# Patient Record
Sex: Male | Born: 1995 | Race: Black or African American | Hispanic: No | Marital: Single | Smoking: Never smoker
Health system: Southern US, Community
[De-identification: ages and names within clinical notes are randomized; demographics above are authoritative.]

## PROBLEM LIST (undated history)

## (undated) DIAGNOSIS — R569 Unspecified convulsions: Secondary | ICD-10-CM

---
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
## 2019-08-09 ENCOUNTER — Emergency Department (HOSPITAL_COMMUNITY): Payer: Medicaid Other

## 2019-08-09 ENCOUNTER — Encounter (HOSPITAL_COMMUNITY): Payer: Self-pay | Admitting: Emergency Medicine

## 2019-08-09 ENCOUNTER — Other Ambulatory Visit: Payer: Self-pay

## 2019-08-09 ENCOUNTER — Emergency Department (HOSPITAL_COMMUNITY)
Admission: EM | Admit: 2019-08-09 | Discharge: 2019-08-09 | Disposition: A | Discharge status: Home / Self Care | Payer: Medicaid Other | Attending: Emergency Medicine | Admitting: Emergency Medicine

## 2019-08-09 DIAGNOSIS — R569 Unspecified convulsions: Secondary | ICD-10-CM | POA: Insufficient documentation

## 2019-08-09 LAB — ETHANOL: Alcohol, Ethyl (B): <10 mg/dL (ref <10)

## 2019-08-09 LAB — COMPREHENSIVE METABOLIC PANEL
ALT: 13 U/L (ref 0–44)
AST: 22 U/L (ref 15–41)
Albumin: 4.2 g/dL (ref 3.5–5.0)
Alkaline Phosphatase: 46 U/L (ref 38–126)
Anion gap: 11 (ref 5–15)
BUN: 8 mg/dL (ref 6–20)
CO2: 27 mmol/L (ref 22–32)
Calcium: 9.4 mg/dL (ref 8.9–10.3)
Chloride: 103 mmol/L (ref 98–111)
Creatinine, Ser: 0.94 mg/dL (ref 0.61–1.24)
GFR calc Af Amer: >60 mL/min (ref >60)
GFR calc non Af Amer: >60 mL/min (ref >60)
Glucose, Bld: 93 mg/dL (ref 70–99)
Potassium: 4.3 mmol/L (ref 3.5–5.1)
Sodium: 141 mmol/L (ref 135–145)
Total Bilirubin: 1.2 mg/dL (ref 0.3–1.2)
Total Protein: 7.3 g/dL (ref 6.5–8.1)

## 2019-08-09 LAB — RAPID URINE DRUG SCREEN, HOSP PERFORMED
Amphetamines: NOT DETECTED
Barbiturates: NOT DETECTED
Benzodiazepines: NOT DETECTED
Cocaine: NOT DETECTED
Opiates: NOT DETECTED
Tetrahydrocannabinol: NOT DETECTED

## 2019-08-09 LAB — CBC WITH DIFFERENTIAL/PLATELET
Abs Immature Granulocytes: 0.02 10*3/uL (ref 0.00–0.07)
Basophils Absolute: 0.1 10*3/uL (ref 0.0–0.1)
Basophils Relative: 1 %
Eosinophils Absolute: 0.2 10*3/uL (ref 0.0–0.5)
Eosinophils Relative: 2 %
HCT: 43.5 % (ref 39.0–52.0)
Hemoglobin: 14 g/dL (ref 13.0–17.0)
Immature Granulocytes: 0 %
Lymphocytes Relative: 31 %
Lymphs Abs: 2.5 10*3/uL (ref 0.7–4.0)
MCH: 26.3 pg (ref 26.0–34.0)
MCHC: 32.2 g/dL (ref 30.0–36.0)
MCV: 81.6 fL (ref 80.0–100.0)
Monocytes Absolute: 0.6 10*3/uL (ref 0.1–1.0)
Monocytes Relative: 7 %
Neutro Abs: 4.9 10*3/uL (ref 1.7–7.7)
Neutrophils Relative %: 59 %
Platelets: 231 10*3/uL (ref 150–400)
RBC: 5.33 MIL/uL (ref 4.22–5.81)
RDW: 12.3 % (ref 11.5–15.5)
WBC: 8.2 10*3/uL (ref 4.0–10.5)
nRBC: 0 % (ref 0.0–0.2)

## 2019-08-09 MED ORDER — SODIUM CHLORIDE 0.9 % IV BOLUS
1000.0000 mL | Freq: Once | INTRAVENOUS | Status: AC
  Administered 2019-08-09: 1000 mL via INTRAVENOUS

## 2019-08-09 MED ORDER — SODIUM CHLORIDE 0.9 % IV SOLN: INTRAVENOUS | Status: DC

## 2019-08-09 NOTE — Discharge Instructions (Signed)
Follow-up with neurology doctor and consider seeing a psychiatry doctor when you are able.

## 2019-08-09 NOTE — ED Notes (Signed)
Patient Alert and oriented to baseline. Stable and ambulatory to baseline. Patient verbalized understanding of the discharge instructions.  Patient belongings were taken by the patient.   

## 2019-08-09 NOTE — ED Triage Notes (Addendum)
Witnessed Seizure activity lasting about 5 mins. Nurses at facility reports he was postictal for about 1 mins. EMS reports pt was alert and oriented upon arrival. Has not been diagnosed with seizures but states he has had seizures during stressful situations in the past.

## 2019-08-09 NOTE — ED Provider Notes (Signed)
[facility] EMERGENCY DEPARTMENT Provider Note   CSN: [id number] Arrival date & time: 08/09/19  1604     History   Chief Complaint Chief Complaint  Patient presents with  . Seizures    HPI Ronald Jimenez is a 23 y.o. male.     HPI Patient presents to the ED for evaluation of seizure-like activity.  Patient is currently incarcerated.  He had a witnessed episode of shaking/seizure-like activity that lasted for 5 minutes which spontaneously resolved.  Patient apparently was confused and postictal for 1 minutes afterwards and then returned to his baseline.  Upon EMS arrival the patient was alert and awake.  Patient states he has a history of seizures since 2018 after his brother passed away.  Patient states they are triggered by stressful situations.  Often he will see his brother before these episodes occur.  Patient states he may have had maybe 40 episodes in the last 2 years.  He was seen at another emergency room once for but has not followed up with a neurologist since that time.  He is currently incarcerated but that just occurred earlier this month. History reviewed. No pertinent past medical history.  There are no active problems to display for this patient.   History reviewed. No pertinent surgical history.      Home Medications    Prior to Admission medications   Not on File    Family History No family history on file.  Social History Social History   Tobacco Use  . Smoking status: Not on file  Substance Use Topics  . Alcohol use: Not on file  . Drug use: Not on file     Allergies   Patient has no allergy information on record.   Review of Systems Review of Systems  Constitutional: Negative for fever.  Respiratory: Negative for chest tightness.   Cardiovascular: Negative for chest pain.  Gastrointestinal: Negative for abdominal pain.  Genitourinary: Negative for dysuria.  Neurological: Negative for weakness and headaches.  All  other systems reviewed and are negative.    Physical Exam Updated Vital Signs BP (!) 143/91 (BP Location: Right Arm)   Pulse 60   Temp 98.6 F (37 C) (Oral)   Resp 14   Ht 1.676 m (5\' 6" )   Wt 68.5 kg   SpO2 100%   BMI 24.37 kg/m   Physical Exam Vitals signs and nursing note reviewed.  Constitutional:      General: He is not in acute distress.    Appearance: He is well-developed.  HENT:     Head: Normocephalic and atraumatic.     Right Ear: External ear normal.     Left Ear: External ear normal.  Eyes:     General: No scleral icterus.       Right eye: No discharge.        Left eye: No discharge.     Conjunctiva/sclera: Conjunctivae normal.  Neck:     Musculoskeletal: Neck supple.     Trachea: No tracheal deviation.  Cardiovascular:     Rate and Rhythm: Normal rate and regular rhythm.  Pulmonary:     Effort: Pulmonary effort is normal. No respiratory distress.     Breath sounds: Normal breath sounds. No stridor. No wheezing or rales.  Abdominal:     General: Bowel sounds are normal. There is no distension.     Palpations: Abdomen is soft.     Tenderness: There is no abdominal tenderness. There is no guarding or rebound.  Musculoskeletal:        General: No tenderness.  Skin:    General: Skin is warm and dry.     Findings: No rash.  Neurological:     General: No focal deficit present.     Mental Status: He is alert and oriented to person, place, and time. Mental status is at baseline.     Cranial Nerves: No cranial nerve deficit (no facial droop, extraocular movements intact, no slurred speech).     Sensory: No sensory deficit.     Motor: No abnormal muscle tone or seizure activity.     Coordination: Coordination normal.      ED Treatments / Results  Labs (all labs ordered are listed, but only abnormal results are displayed) Labs Reviewed  COMPREHENSIVE METABOLIC PANEL  ETHANOL  CBC WITH DIFFERENTIAL/PLATELET  RAPID URINE DRUG SCREEN, HOSP PERFORMED   CBG MONITORING, ED    EKG EKG Interpretation  Date/Time:  Sunday August 09 2019 16:17:30 EDT Ventricular Rate:  61 PR Interval:    QRS Duration: 85 QT Interval:  413 QTC Calculation: 416 R Axis:   77 Text Interpretation:  Sinus rhythm RAE, consider biatrial enlargement ST elev, probable normal early repol pattern No old tracing to compare Confirmed by Linwood DibblesKnapp, Elis Rawlinson [phone]([id number]) on 08/09/2019 4:18:31 PM   Radiology Ct Head Wo Contrast  Result Date: 08/09/2019 CLINICAL DATA:  Seizure, new, nontraumatic, 18-40 yrs EXAM: CT HEAD WITHOUT CONTRAST TECHNIQUE: Contiguous axial images were obtained from the base of the skull through the vertex without intravenous contrast. COMPARISON:  None. FINDINGS: Brain: No intracranial hemorrhage, mass effect, or midline shift. No hydrocephalus. The basilar cisterns are patent. No evidence of territorial infarct or acute ischemia. No extra-axial or intracranial fluid collection. Vascular: No hyperdense vessel or unexpected calcification. Skull: No fracture or focal lesion. Sinuses/Orbits: Mucosal thickening of the ethmoid air cells, frontal, and maxillary sinuses. The mastoid air cells are clear. Other: Minimal right parietal scalp scarring. IMPRESSION: 1. Negative CT appearance of the brain. 2. Paranasal sinus mucosal thickening. Electronically Signed   By: Narda RutherfordMelanie  Sanford M.D.   On: 08/09/2019 19:10    Procedures Procedures (including critical care time)  Medications Ordered in ED Medications  sodium chloride 0.9 % bolus 1,000 mL (1,000 mLs Intravenous New Bag/Given 08/09/19 1724)    And  0.9 %  sodium chloride infusion (has no administration in time range)     Initial Impression / Assessment and Plan / ED Course  I have reviewed the triage vital signs and the nursing notes.  Pertinent labs & imaging results that were available during my care of the patient were reviewed by me and considered in my medical decision making (see chart for details).    Patient's ED work-up is reassuring.  Laboratory tests and CT scan are unremarkable.  Patient was monitored in the ED and there were no recurrent episodes.  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  Patient states his episodes are triggered by stressful activity.  These have been associated with recent traumas and his wife with the death of his brother and his uncle.  Patient is also currently incarcerated.  He admits to being under a lot of stress.  I think the patient would benefit from outpatient neurology and psychiatry evaluation but he is stable for discharge and does not require hospitalization at this time.  Pt is agreeable to this plan.  Final Clinical Impressions(s) / ED Diagnoses   Final diagnoses:  Seizure-like activity (HCC)  ED Discharge Orders    None       Dorie Rank, MD 08/09/19 1931

## 2020-09-25 ENCOUNTER — Encounter (HOSPITAL_COMMUNITY): Payer: Self-pay | Admitting: Emergency Medicine

## 2020-09-25 ENCOUNTER — Emergency Department (HOSPITAL_COMMUNITY)
Admission: EM | Admit: 2020-09-25 | Discharge: 2020-09-25 | Disposition: A | Discharge status: Home / Self Care | Attending: Emergency Medicine | Admitting: Emergency Medicine

## 2020-09-25 ENCOUNTER — Other Ambulatory Visit: Payer: Self-pay

## 2020-09-25 DIAGNOSIS — R569 Unspecified convulsions: Secondary | ICD-10-CM | POA: Diagnosis present

## 2020-09-25 LAB — CBC
HCT: 47 % (ref 39.0–52.0)
Hemoglobin: 15.3 g/dL (ref 13.0–17.0)
MCH: 26.8 pg (ref 26.0–34.0)
MCHC: 32.6 g/dL (ref 30.0–36.0)
MCV: 82.5 fL (ref 80.0–100.0)
Platelets: 241 10*3/uL (ref 150–400)
RBC: 5.7 MIL/uL (ref 4.22–5.81)
RDW: 12.5 % (ref 11.5–15.5)
WBC: 8.6 10*3/uL (ref 4.0–10.5)
nRBC: 0 % (ref 0.0–0.2)

## 2020-09-25 LAB — BASIC METABOLIC PANEL
Anion gap: 6 (ref 5–15)
BUN: 12 mg/dL (ref 6–20)
CO2: 31 mmol/L (ref 22–32)
Calcium: 9.6 mg/dL (ref 8.9–10.3)
Chloride: 103 mmol/L (ref 98–111)
Creatinine, Ser: 1.04 mg/dL (ref 0.61–1.24)
GFR, Estimated: >60 mL/min (ref >60)
Glucose, Bld: 96 mg/dL (ref 70–99)
Potassium: 4.2 mmol/L (ref 3.5–5.1)
Sodium: 140 mmol/L (ref 135–145)

## 2020-09-25 NOTE — Discharge Instructions (Signed)
Please read and follow all provided instructions.  Your diagnoses today include:  1. Seizure-like activity (HCC)     Tests performed today include: Blood cell counts (white, red, and platelets) Electrolytes  Kidney function test  Vital signs. See below for your results today.   Medications prescribed:   None  Take any prescribed medications only as directed.  Home care instructions:  Follow any educational materials contained in this packet.  BE VERY CAREFUL not to take multiple medicines containing Tylenol (also called acetaminophen). Doing so can lead to an overdose which can damage your liver and cause liver failure and possibly death.   Follow-up instructions: Please follow-up with your primary care provider as needed for further evaluation of your symptoms.   Return instructions:   Please return to the Emergency Department if you experience worsening symptoms.   Please return if you have any other emergent concerns.  Additional Information:  Your vital signs today were: BP 137/83   Pulse 70   Temp 98.5 F (36.9 C) (Oral)   Resp 16   SpO2 100%  If your blood pressure (BP) was elevated above 135/85 this visit, please have this repeated by your doctor within one month. --------------

## 2020-09-25 NOTE — ED Provider Notes (Signed)
[facility]   CSN: [id number] Arrival date & time: 09/25/20  1630     History Chief Complaint  Patient presents with  . Seizures    Ronald Jimenez is a 24 y.o. male.  Patient with history of nonepileptic seizures presents the emergency department with several shaking episodes today.  Per EMS, patient had an episode this morning and then again this afternoon.  Patient states that he does not remember these episodes when he has them.  He has had them in the past when he is in a stressful situation.  He does not take any medications.  Patient is unwilling to answer my questions by shaking his head yes or no.  When asked if he has been stressed out he shakes his head yes.  He denies biting his tongue, headache, nausea or vomiting.  When asked if he has been eating and drinking well, he shakes his head yes.        History reviewed. No pertinent past medical history.  There are no problems to display for this patient.   History reviewed. No pertinent surgical history.     History reviewed. No pertinent family history.  Social History   Tobacco Use  . Smoking status: Not on file  Substance Use Topics  . Alcohol use: Not on file  . Drug use: Not on file    Home Medications Prior to Admission medications   Not on File    Allergies    Penicillin g  Review of Systems   Review of Systems  Constitutional: Negative for fever.  HENT: Negative for rhinorrhea and sore throat.   Eyes: Negative for redness.  Respiratory: Negative for cough.   Cardiovascular: Negative for chest pain.  Gastrointestinal: Negative for abdominal pain, diarrhea, nausea and vomiting.  Genitourinary: Negative for dysuria and hematuria.  Musculoskeletal: Negative for myalgias.  Skin: Negative for rash.  Neurological: Positive for seizures. Negative for headaches.    Physical Exam Updated Vital Signs BP 136/88 (BP Location: Right Arm)   Pulse 72    Temp 98.5 F (36.9 C) (Oral)   Resp 18   SpO2 100%   Physical Exam Vitals and nursing note reviewed.  Constitutional:      Appearance: He is well-developed.  HENT:     Head: Normocephalic and atraumatic.     Mouth/Throat:     Comments: No lateral tongue biting. Eyes:     General:        Right eye: No discharge.        Left eye: No discharge.     Conjunctiva/sclera: Conjunctivae normal.  Cardiovascular:     Rate and Rhythm: Normal rate and regular rhythm.     Heart sounds: Normal heart sounds.  Pulmonary:     Effort: Pulmonary effort is normal.     Breath sounds: Normal breath sounds.  Abdominal:     Palpations: Abdomen is soft.     Tenderness: There is no abdominal tenderness.  Musculoskeletal:     Cervical back: Normal range of motion and neck supple.  Skin:    General: Skin is warm and dry.  Neurological:     General: No focal deficit present.     Mental Status: He is alert. Mental status is at baseline.     Sensory: No sensory deficit.     Motor: No weakness.     ED Results / Procedures / Treatments   Labs (all labs ordered are listed, but only abnormal results  are displayed) Labs Reviewed  CBC  BASIC METABOLIC PANEL    EKG None  Radiology No results found.  Procedures Procedures (including critical care time)  Medications Ordered in ED Medications - No data to display  ED Course  I have reviewed the triage vital signs and the nursing notes.  Pertinent labs & imaging results that were available during my care of the patient were reviewed by me and considered in my medical decision making (see chart for details).  Patient seen and examined. Work-up initiated.   Vital signs reviewed and are as follows: BP 136/88 (BP Location: Right Arm)   Pulse 72   Temp 98.5 F (36.9 C) (Oral)   Resp 18   SpO2 100%   Patient is currently at baseline.  No postictal state.  No obvious or gross neurologic abnormalities.  Will monitor and check CBC BMP as he  is coming from [facility].  If these are normal, anticipate discharge.  Symptoms are consistent with previous visits for nonepileptic seizures.  I reviewed 2 charts from 2020 which were similar.  6:39 PM labs are normal.  Patient stable.  Ready for discharge.    MDM Rules/Calculators/A&P                          Patient with history of nonepileptic seizures, here after 2 seizure-like spells today.  He is stable during ED stay.  No indication for further evaluation.  He has had previous head CTs which were negative.  Plan for discharge.   Final Clinical Impression(s) / ED Diagnoses Final diagnoses:  Seizure-like activity [facility])    Rx / DC Orders ED Discharge Orders    None       Renne Crigler, PA-C 09/25/20 1839    Charlynne Pander, MD 09/25/20 2026

## 2020-09-25 NOTE — ED Triage Notes (Signed)
BIBA Per EMS: Pt coming from jail with seizure like activity that began this morning. Only lasted a few minutes and then Pt experienced activity again this afternoon. Seizure like activity goes and comes per EMS.  Hx pseudo seizures  Vitals:  97 CBG  132/80  100 room air

## 2020-11-05 ENCOUNTER — Inpatient Hospital Stay (HOSPITAL_COMMUNITY)
Admission: EM | Admit: 2020-11-05 | Discharge: 2020-11-08 | DRG: 101 | Attending: Internal Medicine | Admitting: Internal Medicine

## 2020-11-05 ENCOUNTER — Other Ambulatory Visit: Payer: Self-pay

## 2020-11-05 DIAGNOSIS — T424X5A Adverse effect of benzodiazepines, initial encounter: Secondary | ICD-10-CM | POA: Diagnosis present

## 2020-11-05 DIAGNOSIS — Z20822 Contact with and (suspected) exposure to covid-19: Secondary | ICD-10-CM | POA: Diagnosis present

## 2020-11-05 DIAGNOSIS — R569 Unspecified convulsions: Principal | ICD-10-CM | POA: Diagnosis present

## 2020-11-05 DIAGNOSIS — Z880 Allergy status to penicillin: Secondary | ICD-10-CM

## 2020-11-05 DIAGNOSIS — Z79899 Other long term (current) drug therapy: Secondary | ICD-10-CM

## 2020-11-05 DIAGNOSIS — R9401 Abnormal electroencephalogram [EEG]: Secondary | ICD-10-CM | POA: Diagnosis present

## 2020-11-05 DIAGNOSIS — F319 Bipolar disorder, unspecified: Secondary | ICD-10-CM | POA: Diagnosis present

## 2020-11-05 LAB — VALPROIC ACID LEVEL: Valproic Acid Lvl: 38 ug/mL — ABNORMAL LOW (ref 50.0–100.0)

## 2020-11-05 LAB — CBC WITH DIFFERENTIAL/PLATELET
Abs Immature Granulocytes: 0.01 10*3/uL (ref 0.00–0.07)
Basophils Absolute: 0.1 10*3/uL (ref 0.0–0.1)
Basophils Relative: 1 %
Eosinophils Absolute: 0.5 10*3/uL (ref 0.0–0.5)
Eosinophils Relative: 7 %
HCT: 45.6 % (ref 39.0–52.0)
Hemoglobin: 14.6 g/dL (ref 13.0–17.0)
Immature Granulocytes: 0 %
Lymphocytes Relative: 29 %
Lymphs Abs: 2.2 10*3/uL (ref 0.7–4.0)
MCH: 26.8 pg (ref 26.0–34.0)
MCHC: 32 g/dL (ref 30.0–36.0)
MCV: 83.7 fL (ref 80.0–100.0)
Monocytes Absolute: 0.6 10*3/uL (ref 0.1–1.0)
Monocytes Relative: 8 %
Neutro Abs: 4.2 10*3/uL (ref 1.7–7.7)
Neutrophils Relative %: 55 %
Platelets: 237 10*3/uL (ref 150–400)
RBC: 5.45 MIL/uL (ref 4.22–5.81)
RDW: 12.7 % (ref 11.5–15.5)
WBC: 7.7 10*3/uL (ref 4.0–10.5)
nRBC: 0 % (ref 0.0–0.2)

## 2020-11-05 LAB — COMPREHENSIVE METABOLIC PANEL
ALT: 37 U/L (ref 0–44)
AST: 27 U/L (ref 15–41)
Albumin: 4 g/dL (ref 3.5–5.0)
Alkaline Phosphatase: 55 U/L (ref 38–126)
Anion gap: 7 (ref 5–15)
BUN: 13 mg/dL (ref 6–20)
CO2: 28 mmol/L (ref 22–32)
Calcium: 9 mg/dL (ref 8.9–10.3)
Chloride: 104 mmol/L (ref 98–111)
Creatinine, Ser: 0.95 mg/dL (ref 0.61–1.24)
GFR, Estimated: >60 mL/min (ref >60)
Glucose, Bld: 99 mg/dL (ref 70–99)
Potassium: 4 mmol/L (ref 3.5–5.1)
Sodium: 139 mmol/L (ref 135–145)
Total Bilirubin: 0.4 mg/dL (ref 0.3–1.2)
Total Protein: 7.4 g/dL (ref 6.5–8.1)

## 2020-11-05 LAB — CBG MONITORING, ED: Glucose-Capillary: 99 mg/dL (ref 70–99)

## 2020-11-05 NOTE — ED Provider Notes (Signed)
[facility]-EMERG[facility] DEPT Provider Note   CSN: [id number] Arrival date & time: 11/05/20  2117     History Chief Complaint  Patient presents with  . Seizures    Ronald Jimenez is a 24 y.o. male presenting for evaluation of seizure.  Patient states he had a seizure while incarcerated.  He states he was laying down when this happened, and immediate noticing shaking.  He states he was slightly confused afterwards, but no also bowel bladder control.  He did not bite his tongue.  He has no pain after the seizure.  He states this is his third seizure that he has had.  He is not currently on any medication for seizures.  He denies recent fevers, chills, URI symptoms, cough, chest pain, shortness breath, nausea, vomiting, abdominal pain, urinary symptoms, abnormal bowel movements.  He has not yet followed up outpatient with neurology.  He states he is on Depakote and Abilify and "something else "for bipolar.  HPI     No past medical history on file.  There are no problems to display for this patient.   No past surgical history on file.     No family history on file.     Home Medications Prior to Admission medications   Medication Sig Start Date End Date Taking? Authorizing Provider  ARIPiprazole (ABILIFY PO) Take by mouth.    [provider]  Divalproex Sodium (DEPAKOTE PO) Take by mouth.    [provider]    Allergies    Penicillin g  Review of Systems   Review of Systems  Neurological: Positive for seizures.  All other systems reviewed and are negative.   Physical Exam Updated Vital Signs BP 138/86   Pulse 66   Temp 98.4 F (36.9 C) (Oral)   Resp 16   Ht 5\' 7"  (1.702 m)   Wt 64.4 kg   SpO2 98%   BMI 22.24 kg/m   Physical Exam Vitals and nursing note reviewed.  Constitutional:      General: He is not in acute distress.    Appearance: He is well-developed and well-nourished.     Comments: Appears nontoxic  HENT:      Head: Normocephalic and atraumatic.  Eyes:     Extraocular Movements: Extraocular movements intact and EOM normal.     Conjunctiva/sclera: Conjunctivae normal.     Pupils: Pupils are equal, round, and reactive to light.  Neck:     Comments: No ttp Cardiovascular:     Rate and Rhythm: Normal rate and regular rhythm.     Pulses: Normal pulses and intact distal pulses.  Pulmonary:     Effort: Pulmonary effort is normal. No respiratory distress.     Breath sounds: Normal breath sounds. No wheezing.  Abdominal:     General: There is no distension.     Palpations: Abdomen is soft. There is no mass.     Tenderness: There is no abdominal tenderness. There is no guarding or rebound.  Musculoskeletal:     Cervical back: Normal range of motion and neck supple.     Comments: ROM of extremities limited by shackles.   Skin:    General: Skin is warm and dry.     Capillary Refill: Capillary refill takes less than 2 seconds.  Neurological:     Mental Status: He is alert and oriented to person, place, and time.     GCS: GCS eye subscore is 4. GCS verbal subscore is 5. GCS motor subscore is  6.     Cranial Nerves: Cranial nerves are intact.     Sensory: Sensation is intact.     Comments: GCS of 15.  CN intact.  Sensation intact x4.  Grip strength equal bilaterally.  Ankle flexion and extension equal bilaterally.  Psychiatric:        Mood and Affect: Mood and affect normal.     ED Results / Procedures / Treatments   Labs (all labs ordered are listed, but only abnormal results are displayed) Labs Reviewed  VALPROIC ACID LEVEL - Abnormal; Notable for the following components:      Result Value   Valproic Acid Lvl 38 (*)    All other components within normal limits  RESP PANEL BY RT-PCR (FLU A&B, COVID) ARPGX2  CBC WITH DIFFERENTIAL/PLATELET  COMPREHENSIVE METABOLIC PANEL  URINALYSIS, ROUTINE W REFLEX MICROSCOPIC  RAPID URINE DRUG SCREEN, HOSP PERFORMED  CBG MONITORING, ED     EKG None  Radiology No results found.  Procedures Procedures (including critical care time)  Medications Ordered in ED Medications  valproate (DEPACON) 1,000 mg in dextrose 5 % 50 mL IVPB (0 mg Intravenous Stopped 11/06/20 0152)    ED Course  I have reviewed the triage vital signs and the nursing notes.  Pertinent labs & imaging results that were available during my care of the patient were reviewed by me and considered in my medical decision making (see chart for details).    MDM Rules/Calculators/A&P                          Patient resenting for evaluation of supposed seizure.  On exam, patient is nontoxic.  Unable to verify patient truly had a postictal period.  This is supposedly patient's third seizure, as such, he may need to be on antiepileptics.  However he does report he is on Depakote, this is for bipolar.  Will consult with neurology.  Will obtain labs to ensure no underlying metabolic abnormality.  Labs interpreted by me, overall reassuring.  No obvious electrolyte abnormality.  As patient had a negative head CT last time, I do not believe he needs repeat.  Will consult neurology.  Discussed with Dr. Derry Lory from neurology, who recommends 1000 mg loading dose of Depakote.  Recommends 24-hour observation to ensure no further seizures.  As we do not know what dose of Depakote patient is on daily, he recommends at discharge patient be instructed to take 250 mg higher dose that he is currently on both in the morning and at night.  Discussed with Dr Sedalia Muta from the admitting team, patient to be admitted.  Final Clinical Impression(s) / ED Diagnoses Final diagnoses:  Seizure-like activity [facility])    Rx / DC Orders ED Discharge Orders    None       Alveria Apley, PA-C 11/06/20 0450    Molpus, Jonny Ruiz, MD 11/06/20 [phone]

## 2020-11-05 NOTE — ED Triage Notes (Signed)
Ar[facility] by EMS from [facility] facility. Patient had 2 witnessed seizures by staff. First seizure around 1830 second seizure around 2100. Patient A&O X4 on arrival to this facility.

## 2020-11-06 ENCOUNTER — Observation Stay (HOSPITAL_COMMUNITY)

## 2020-11-06 DIAGNOSIS — T424X5A Adverse effect of benzodiazepines, initial encounter: Secondary | ICD-10-CM | POA: Diagnosis present

## 2020-11-06 DIAGNOSIS — Z880 Allergy status to penicillin: Secondary | ICD-10-CM | POA: Diagnosis not present

## 2020-11-06 DIAGNOSIS — Z79899 Other long term (current) drug therapy: Secondary | ICD-10-CM | POA: Diagnosis not present

## 2020-11-06 DIAGNOSIS — Z20822 Contact with and (suspected) exposure to covid-19: Secondary | ICD-10-CM | POA: Diagnosis present

## 2020-11-06 DIAGNOSIS — F319 Bipolar disorder, unspecified: Secondary | ICD-10-CM | POA: Diagnosis present

## 2020-11-06 DIAGNOSIS — R569 Unspecified convulsions: Secondary | ICD-10-CM | POA: Diagnosis present

## 2020-11-06 DIAGNOSIS — R9401 Abnormal electroencephalogram [EEG]: Secondary | ICD-10-CM | POA: Diagnosis present

## 2020-11-06 LAB — URINALYSIS, ROUTINE W REFLEX MICROSCOPIC
Bilirubin Urine: NEGATIVE
Glucose, UA: NEGATIVE mg/dL
Hgb urine dipstick: NEGATIVE
Ketones, ur: NEGATIVE mg/dL
Leukocytes,Ua: NEGATIVE
Nitrite: NEGATIVE
Protein, ur: NEGATIVE mg/dL
Specific Gravity, Urine: 1.023 (ref 1.005–1.030)
pH: 8 (ref 5.0–8.0)

## 2020-11-06 LAB — RAPID URINE DRUG SCREEN, HOSP PERFORMED
Amphetamines: NOT DETECTED
Barbiturates: NOT DETECTED
Benzodiazepines: NOT DETECTED
Cocaine: NOT DETECTED
Opiates: NOT DETECTED
Tetrahydrocannabinol: NOT DETECTED

## 2020-11-06 LAB — RESP PANEL BY RT-PCR (FLU A&B, COVID) ARPGX2
Influenza A by PCR: NEGATIVE
Influenza B by PCR: NEGATIVE
SARS Coronavirus 2 by RT PCR: NEGATIVE

## 2020-11-06 LAB — HIV ANTIBODY (ROUTINE TESTING W REFLEX): HIV Screen 4th Generation wRfx: NONREACTIVE

## 2020-11-06 MED ORDER — ACETAMINOPHEN 325 MG PO TABS: 650.0000 mg | ORAL_TABLET | ORAL | Status: DC | PRN

## 2020-11-06 MED ORDER — LAMOTRIGINE 25 MG PO TABS
50.0000 mg | ORAL_TABLET | Freq: Every evening | ORAL | Status: DC
  Administered 2020-11-06 – 2020-11-07 (×2): 50 mg via ORAL
  Filled 2020-11-06 (×2): qty 2

## 2020-11-06 MED ORDER — CARBAMAZEPINE 200 MG PO TABS
200.0000 mg | ORAL_TABLET | Freq: Two times a day (BID) | ORAL | Status: DC
  Administered 2020-11-06 – 2020-11-08 (×5): 200 mg via ORAL
  Filled 2020-11-06 (×5): qty 1

## 2020-11-06 MED ORDER — SODIUM CHLORIDE 0.9 % IV SOLN
75.0000 mL/h | INTRAVENOUS | Status: DC
  Administered 2020-11-06 – 2020-11-08 (×4): 75 mL/h via INTRAVENOUS

## 2020-11-06 MED ORDER — VALPROATE SODIUM 100 MG/ML IV SOLN
1000.0000 mg | Freq: Once | INTRAVENOUS | Status: AC
  Administered 2020-11-06: 01:00:00 1000 mg via INTRAVENOUS
  Filled 2020-11-06: qty 10

## 2020-11-06 MED ORDER — DIVALPROEX SODIUM 500 MG PO DR TAB: 500.0000 mg | DELAYED_RELEASE_TABLET | Freq: Two times a day (BID) | ORAL | Status: DC

## 2020-11-06 MED ORDER — DIVALPROEX SODIUM 250 MG PO DR TAB
500.0000 mg | DELAYED_RELEASE_TABLET | Freq: Two times a day (BID) | ORAL | Status: DC
  Administered 2020-11-06 – 2020-11-08 (×4): 500 mg via ORAL
  Filled 2020-11-06 (×4): qty 2

## 2020-11-06 MED ORDER — ONDANSETRON HCL 4 MG/2ML IJ SOLN: 4.0000 mg | Freq: Four times a day (QID) | INTRAMUSCULAR | Status: DC | PRN

## 2020-11-06 MED ORDER — ACETAMINOPHEN 650 MG RE SUPP: 650.0000 mg | RECTAL | Status: DC | PRN

## 2020-11-06 MED ORDER — VALPROATE SODIUM 100 MG/ML IV SOLN: 1000.0000 mg | Freq: Once | INTRAVENOUS | Status: DC

## 2020-11-06 MED ORDER — LORAZEPAM 2 MG/ML IJ SOLN: 1.0000 mg | INTRAMUSCULAR | Status: DC | PRN

## 2020-11-06 MED ORDER — ONDANSETRON HCL 4 MG PO TABS: 4.0000 mg | ORAL_TABLET | Freq: Four times a day (QID) | ORAL | Status: DC | PRN

## 2020-11-06 NOTE — ED Notes (Signed)
Pt. Made a need for urine specimen.

## 2020-11-06 NOTE — H&P (Addendum)
History and Physical    Ronald Jimenez FTD:[id number] DOB: Jul 12, 1996 DOA: 11/05/2020  PCP: Patient, No Pcp Per  [facility] coming from: Jail  Chief Complaint: Seizure  HPI: Ronald Jimenez is a 24 y.o. male with medical history significant of bipolar d/o and seizure. He reports that he had a seizure last night. It was witnessed by a fellow inmate. He reports that he was asleep and was not aware of the event. Neither did he have any heralding symptoms. He reports that he was woken by another inmate who said he has having tonic-clonic movements and eyes rolled into the back of his head. He notified the guards and the patient was brought to the ED.    ED Course: Neuro was contacted. Recommended depakote load and observation. TRH was called for admission.   Review of Systems:  No CP, dyspnea, palpitations, incontinence.  Review of systems is otherwise negative for all not mentioned in HPI.   PMHx Hx of seizures, bipolar  PSHx No past surgical history on file.  SocHx  has no history on file for tobacco use, alcohol use, and drug use.  Allergies  Allergen Reactions  . Penicillin G Anaphylaxis    FamHx No family history on file.  Prior to Admission medications   Medication Sig Start Date End Date Taking? Authorizing Provider  ARIPiprazole (ABILIFY PO) Take by mouth.    [provider]  Divalproex Sodium (DEPAKOTE PO) Take by mouth.    [provider]    Physical Exam: Vitals:   11/06/20 0530 11/06/20 0600 11/06/20 0615 11/06/20 0700  BP: 120/67 132/71 122/73 131/73  Pulse: 60 60 60 64  Resp: 11 14 16 20   Temp:      TempSrc:      SpO2: 98% 98% 97% 97%  Weight:      Height:        General: 24 y.o. male resting in bed in NAD Eyes: PERRL, normal sclera ENMT: Nares patent w/o discharge, orophaynx clear, dentition normal, ears w/o discharge/lesions/ulcers Neck: Supple, trachea midline Cardiovascular: RRR, +S1, S2, no m/g/r, equal pulses  throughout Respiratory: CTABL, no w/r/r, normal WOB GI: BS+, NDNT, no masses noted, no organomegaly noted MSK: No e/c/c Skin: No rashes, bruises, ulcerations noted Neuro: A&O x 3, no focal deficits Psyc: Appropriate interaction and affect, calm/cooperative  Labs on Admission: I have personally reviewed following labs and imaging studies  CBC: Recent Labs  Lab 11/05/20 2304  WBC 7.7  NEUTROABS 4.2  HGB 14.6  HCT 45.6  MCV 83.7  PLT 237   Basic Metabolic Panel: Recent Labs  Lab 11/05/20 2304  NA 139  K 4.0  CL 104  CO2 28  GLUCOSE 99  BUN 13  CREATININE 0.95  CALCIUM 9.0   GFR: Estimated Creatinine Clearance: 109.2 mL/min (by C-G formula based on SCr of 0.95 mg/dL). Liver Function Tests: Recent Labs  Lab 11/05/20 2304  AST 27  ALT 37  ALKPHOS 55  BILITOT 0.4  PROT 7.4  ALBUMIN 4.0   No results for input(s): LIPASE, AMYLASE in the last 168 hours. No results for input(s): AMMONIA in the last 168 hours. Coagulation Profile: No results for input(s): INR, PROTIME in the last 168 hours. Cardiac Enzymes: No results for input(s): CKTOTAL, CKMB, CKMBINDEX, TROPONINI in the last 168 hours. BNP (last 3 results) No results for input(s): PROBNP in the last 8760 hours. HbA1C: No results for input(s): HGBA1C in the last 72 hours. CBG: Recent Labs  Lab 11/05/20 2142  GLUCAP  99   Lipid Profile: No results for input(s): CHOL, HDL, LDLCALC, TRIG, CHOLHDL, LDLDIRECT in the last 72 hours. Thyroid Function Tests: No results for input(s): TSH, T4TOTAL, FREET4, T3FREE, THYROIDAB in the last 72 hours. Anemia Panel: No results for input(s): VITAMINB12, FOLATE, FERRITIN, TIBC, IRON, RETICCTPCT in the last 72 hours. Urine analysis:    Component Value Date/Time   COLORURINE YELLOW 11/05/2020 2253   APPEARANCEUR CLEAR 11/05/2020 2253   LABSPEC 1.023 11/05/2020 2253   PHURINE 8.0 11/05/2020 2253   GLUCOSEU NEGATIVE 11/05/2020 2253   HGBUR NEGATIVE 11/05/2020 2253    BILIRUBINUR NEGATIVE 11/05/2020 2253   KETONESUR NEGATIVE 11/05/2020 2253   PROTEINUR NEGATIVE 11/05/2020 2253   NITRITE NEGATIVE 11/05/2020 2253   LEUKOCYTESUR NEGATIVE 11/05/2020 2253    Radiological Exams on Admission: No results found.  EKG: Independently reviewed. Sinus, no st changes  Assessment/Plan Seizures     - admit to progressive, obs     - loaded with 1g depakote     - trying to get his home dosage of depakote; neurology rec's discharging him on 250mg  dosing higher than his current home dosage after his is stable for 24 hrs.     - EEG, CTH  Bipolar d/o     - continue home meds  DVT prophylaxis: SCDs  Code Status: FULL  Family Communication: None at bedside  Consults called: EDP spoke neurology   Status is: Observation  The patient remains OBS appropriate and will d/c before 2 midnights.  Dispo: The patient is from: Home              Anticipated d/c is to: Home              Anticipated d/c date is: 1 day              Patient currently is not medically stable to d/c.  DO [facility]  If 7PM-7AM, please contact night-coverage [email or web address]  11/06/2020, 8:23 AM

## 2020-11-07 ENCOUNTER — Inpatient Hospital Stay (HOSPITAL_COMMUNITY)
Admit: 2020-11-07 | Discharge: 2020-11-07 | Disposition: A | Discharge status: Home / Self Care | Attending: Internal Medicine | Admitting: Internal Medicine

## 2020-11-07 ENCOUNTER — Encounter (HOSPITAL_COMMUNITY): Payer: Self-pay | Admitting: Internal Medicine

## 2020-11-07 DIAGNOSIS — F319 Bipolar disorder, unspecified: Secondary | ICD-10-CM | POA: Diagnosis not present

## 2020-11-07 DIAGNOSIS — R569 Unspecified convulsions: Secondary | ICD-10-CM | POA: Diagnosis not present

## 2020-11-07 MED ORDER — LAMOTRIGINE 100 MG PO TABS: 100.0000 mg | ORAL_TABLET | Freq: Every day | ORAL | Status: DC

## 2020-11-07 NOTE — Progress Notes (Signed)
EEG Completed; Results Pending  

## 2020-11-07 NOTE — Procedures (Signed)
Patient Name: Ronald Jimenez  MRN: [id number]  Epilepsy Attending: Charlsie Quest  Referring Physician/Provider: Dr. Margie Ege Date: 11/07/2020 Duration: 23.25 minutes  Patient history: 24 year old male admitted for seizures.  EEG telemetry for seizures.  Level of alertness: Awake  AEDs during EEG study: Depakote, carbamazepine, lamotrigine  Technical aspects: This EEG study was done with scalp electrodes positioned according to the 10-20 International system of electrode placement. Electrical activity was acquired at a sampling rate of 500Hz  and reviewed with a high frequency filter of 70Hz  and a low frequency filter of 1Hz . EEG data were recorded continuously and digitally stored.   Description: No clear posterior dominant rhythm was seen.  EEG showed 15 to 18 Hz beta activity distributed symmetrically and diffusely. Hyperventilation did not show any EEG change.  Physiologic photic driving was seen during photic stimulation.      ABNORMALITY -Excessive beta, generalized  IMPRESSION: This study is within normal limits. The excessive beta activity seen in the background is most likely due to the effect of benzodiazepine and is a benign EEG pattern. No seizures or epileptiform discharges were seen throughout the recording.  [name] 

## 2020-11-07 NOTE — Progress Notes (Signed)
PROGRESS NOTE    Talis Iwan  WLS:[id number] DOB: Dec 31, 1995 DOA: 11/05/2020 PCP: Patient, No Pcp Per    Chief Complaint  Patient presents with  . Seizures    Brief Narrative: 24 year old gentleman prior history of bipolar disorder and seizures, presented with seizures. Neurology consulted , recommended depakote load and observation.    Assessment & Plan:   Active Problems:   Seizures (HCC)   Seizure None since admission. Neurology consulted, recommended depakote 588m g BID, lamictal 50 daily for 4 doses, followed by 100 mg on 12/30.  Tegretol 200 mg BID.   EEG shows The excessive beta activity seen in the background is most likely due to the effect of benzodiazepine and is a benign EEG pattern. No seizures or epileptiform discharges were seen throughout the recording.  Recommend continue the meds for another 24 hours.    DVT prophylaxis: scd's Code Status: (Full code.  Family Communication: none at bedside.  Disposition:   Status is: Inpatient  Remains inpatient appropriate because:IV treatments appropriate due to intensity of illness or inability to take PO and Inpatient level of care appropriate due to severity of illness   Dispo: The patient is from: detention              Anticipated d/c is to: detention              Anticipated d/c date is: 1 day              Patient currently is not medically stable to d/c.       Consultants:   None.    Procedures: EEG   Antimicrobials: none/    Subjective: No new complaints.   Objective: Vitals:   11/06/20 1654 11/06/20 2143 11/07/20 0420 11/07/20 1540  BP: 132/70 133/69 104/64 132/68  Pulse: 66 68 (!) 59 60  Resp: 17 16 16 14   Temp:  98.1 F (36.7 C) 98.4 F (36.9 C) 98.2 F (36.8 C)  TempSrc:  Oral Oral Oral  SpO2: 100% 100% 98% 98%  Weight:      Height:        Intake/Output Summary (Last 24 hours) at 11/07/2020 1611 Last data filed at 11/07/2020 1500 Gross per 24 hour  Intake 1765 ml   Output --  Net 1765 ml   Filed Weights   11/05/20 2144  Weight: 64.4 kg    Examination:  General exam: Appears calm and comfortable  Respiratory system: Clear to auscultation. Respiratory effort normal. Cardiovascular system: S1 & S2 heard, RRR. No JVD,No pedal edema. Gastrointestinal system: Abdomen is nondistended, soft and nontender. Normal bowel sounds heard. Central nervous system: Alert and oriented. No focal neurological deficits. Extremities: Symmetric 5 x 5 power. Skin: No rashes, lesions or ulcers Psychiatry:Mood & affect appropriate.     Data Reviewed: I have personally reviewed following labs and imaging studies  CBC: Recent Labs  Lab 11/05/20 2304  WBC 7.7  NEUTROABS 4.2  HGB 14.6  HCT 45.6  MCV 83.7  PLT 237    Basic Metabolic Panel: Recent Labs  Lab 11/05/20 2304  NA 139  K 4.0  CL 104  CO2 28  GLUCOSE 99  BUN 13  CREATININE 0.95  CALCIUM 9.0    GFR: Estimated Creatinine Clearance: 109.2 mL/min (by C-G formula based on SCr of 0.95 mg/dL).  Liver Function Tests: Recent Labs  Lab 11/05/20 2304  AST 27  ALT 37  ALKPHOS 55  BILITOT 0.4  PROT 7.4  ALBUMIN 4.0  CBG: Recent Labs  Lab 11/05/20 2142  GLUCAP 99     Recent Results (from the past 240 hour(s))  Resp Panel by RT-PCR (Flu A&B, Covid) Nasopharyngeal Swab     Status: None   Collection Time: 11/06/20 12:24 AM   Specimen: Nasopharyngeal Swab; Nasopharyngeal(NP) swabs in vial transport medium  Result Value Ref Range Status   SARS Coronavirus 2 by RT PCR NEGATIVE NEGATIVE Final    Comment: (NOTE) SARS-CoV-2 target nucleic acids are NOT DETECTED.  The SARS-CoV-2 RNA is generally detectable in upper respiratory specimens during the acute phase of infection. The lowest concentration of SARS-CoV-2 viral copies this assay can detect is 138 copies/mL. A negative result does not preclude SARS-Cov-2 infection and should not be used as the sole basis for treatment or other  patient management decisions. A negative result may occur with  improper specimen collection/handling, submission of specimen other than nasopharyngeal swab, presence of viral mutation(s) within the areas targeted by this assay, and inadequate number of viral copies(<138 copies/mL). A negative result must be combined with clinical observations, patient history, and epidemiological information. The expected result is Negative.  Fact Sheet for Patients:  BloggerCourse.com  Fact Sheet for Healthcare Providers:  SeriousBroker.it  This test is no t yet approved or cleared by the [place] FDA and  has been authorized for detection and/or diagnosis of SARS-CoV-2 by FDA under an Emergency Use Authorization (EUA). This EUA will remain  in effect (meaning this test can be used) for the duration of the COVID-19 declaration under Section 564(b)(1) of the Act, 21 U.S.C.section 360bbb-3(b)(1), unless the authorization is terminated  or revoked sooner.       Influenza A by PCR NEGATIVE NEGATIVE Final   Influenza B by PCR NEGATIVE NEGATIVE Final    Comment: (NOTE) The Xpert Xpress SARS-CoV-2/FLU/RSV plus assay is intended as an aid in the diagnosis of influenza from Nasopharyngeal swab specimens and should not be used as a sole basis for treatment. Nasal washings and aspirates are unacceptable for Xpert Xpress SARS-CoV-2/FLU/RSV testing.  Fact Sheet for Patients: BloggerCourse.com  Fact Sheet for Healthcare Providers: SeriousBroker.it  This test is not yet approved or cleared by the [place] FDA and has been authorized for detection and/or diagnosis of SARS-CoV-2 by FDA under an Emergency Use Authorization (EUA). This EUA will remain in effect (meaning this test can be used) for the duration of the COVID-19 declaration under Section 564(b)(1) of the Act, 21 U.S.C. section  360bbb-3(b)(1), unless the authorization is terminated or revoked.  Performed at [facility], [address], [place], [place] [address]          Radiology Studies: EEG now  Result Date: 11/07/2020 Charlsie Quest, MD     11/07/2020  9:50 AM Patient Name: Ronald Jimenez MRN: [id number] Epilepsy Attending: Charlsie Quest Referring Physician/Provider: Dr. Margie Ege Date: 11/07/2020 Duration: 23.25 minutes Patient history: 24 year old male admitted for seizures.  EEG telemetry for seizures. Level of alertness: Awake AEDs during EEG study: Depakote, carbamazepine, lamotrigine Technical aspects: This EEG study was done with scalp electrodes positioned according to the 10-20 International system of electrode placement. Electrical activity was acquired at a sampling rate of 500Hz  and reviewed with a high frequency filter of 70Hz  and a low frequency filter of 1Hz . EEG data were recorded continuously and digitally stored. Description: No clear posterior dominant rhythm was seen.  EEG showed 15 to 18 Hz beta activity distributed symmetrically and diffusely. Hyperventilation did not show any EEG  change.  Physiologic photic driving was seen during photic stimulation.    ABNORMALITY -Excessive beta, generalized IMPRESSION: This study is within normal limits. The excessive beta activity seen in the background is most likely due to the effect of benzodiazepine and is a benign EEG pattern. No seizures or epileptiform discharges were seen throughout the recording. Charlsie Quest   CT HEAD WO CONTRAST  Result Date: 11/06/2020 CLINICAL DATA:  Seizure EXAM: CT HEAD WITHOUT CONTRAST TECHNIQUE: Contiguous axial images were obtained from the base of the skull through the vertex without intravenous contrast. COMPARISON:  08/09/2019 FINDINGS: Brain: No evidence of acute infarction, hemorrhage, hydrocephalus, extra-axial collection or mass lesion/mass effect. Vascular: No hyperdense vessel or  unexpected calcification. Skull: Normal. Negative for fracture or focal lesion. Sinuses/Orbits: Partial opacification of the ethmoid air cells, unchanged compared to prior. Other: None. IMPRESSION: 1. No acute intracranial pathology. 2. Partial opacification of the ethmoid air cells, unchanged compared to prior. Correlate for sinusitis. Electronically Signed   By: Lauralyn Primes M.D.   On: 11/06/2020 11:45        Scheduled Meds: . carbamazepine  200 mg Oral BID  . divalproex  500 mg Oral BID  . [START ON 11/10/2020] lamoTRIgine  100 mg Oral Daily  . lamoTRIgine  50 mg Oral QPM   Continuous Infusions: . sodium chloride 75 mL/hr (11/07/20 0547)     LOS: 1 day        Kathlen Mody, MD [facility]   To contact the attending provider between 7A-7P or the covering provider during after hours 7P-7A, please log into the web site [email or web address] and access using universal [place] password for that web site. If you do not have the password, please call the hospital operator.  11/07/2020, 4:11 PM

## 2020-11-08 ENCOUNTER — Encounter (HOSPITAL_COMMUNITY): Payer: Self-pay | Admitting: Internal Medicine

## 2020-11-08 DIAGNOSIS — F319 Bipolar disorder, unspecified: Secondary | ICD-10-CM | POA: Diagnosis not present

## 2020-11-08 MED ORDER — LAMOTRIGINE 100 MG PO TABS: 100.0000 mg | ORAL_TABLET | Freq: Every day | ORAL | 3 refills | Status: DC

## 2020-11-08 MED ORDER — LAMOTRIGINE 25 MG PO TABS: 50.0000 mg | ORAL_TABLET | Freq: Every evening | ORAL | 0 refills | Status: DC

## 2020-11-08 NOTE — Progress Notes (Signed)
Provided and explained discharge instructions to patient and officers present, addressed all concerns and questions. Patient verbalized understanding.  Ronald Jimenez

## 2020-11-08 NOTE — Discharge Summary (Signed)
Physician Discharge Summary  Ronald Jimenez WGN:[id number] DOB: 1996-05-28 DOA: 11/05/2020  PCP: Patient, No Pcp Per  Admit date: 11/05/2020 Discharge date: 11/08/2020  Admitted From: detention.  Disposition:  Detention  Recommendations for Outpatient Follow-up:  1. Follow up with PCP in 1-2 weeks 2. Please obtain BMP/CBC in one week 3. Please follow up with Neurology in 2 to 4 weeks.  Discharge Condition: stable CODE STATUS: FULL CODE Diet recommendation: Heart Healthy   Brief/Interim Summary: 24 year old gentleman prior history of bipolar disorder and seizures, presented with seizures. Neurology consulted , recommended depakote load and observation.   Discharge Diagnoses:  Active Problems:   Seizures (HCC)   Seizure None since admission. Neurology consulted, recommended depakote 584m g BID, lamictal 50 daily for 4 doses, followed by 100 mg on 12/30.  Tegretol 200 mg BID.   EEG shows The excessive beta activity seen in the background is most likely due to the effect of benzodiazepine and is a benign EEG pattern. No seizures or epileptiform discharges were seen throughout the recording.  Discharge Instructions  Discharge Instructions    Diet - low sodium heart healthy   Complete by: As directed    Discharge instructions   Complete by: As directed    Please follow up with Neurology in 2 to 4 weeks.     Allergies as of 11/08/2020      Reactions   Penicillin G Anaphylaxis      Medication List    TAKE these medications   carbamazepine 200 MG tablet Commonly known as: TEGRETOL Take 200 mg by mouth 2 (two) times daily.   divalproex 500 MG DR tablet Commonly known as: DEPAKOTE Take 500 mg by mouth 2 (two) times daily.   lamoTRIgine 25 MG tablet Commonly known as: LAMICTAL Take 2 tablets (50 mg total) by mouth every evening. What changed: additional instructions   lamoTRIgine 100 MG tablet Commonly known as: LAMICTAL Take 1 tablet (100 mg total) by mouth  daily. Start taking on: November 10, 2020 What changed: These instructions start on November 10, 2020. If you are unsure what to do until then, ask your doctor or other care provider.       Follow-up Information    Micki Riley, MD. Schedule an appointment as soon as possible for a visit in 2 week(s).   Specialties: Neurology, Radiology Contact information: [address] [place] [address] [phone]              Allergies  Allergen Reactions  . Penicillin G Anaphylaxis    Consultations: none  Procedures/Studies: EEG now  Result Date: 11/07/2020 Charlsie Quest, MD     11/07/2020  9:50 AM Patient Name: Ronald Jimenez MRN: [id number] Epilepsy Attending: Charlsie Quest Referring Physician/Provider: Dr. Margie Ege Date: 11/07/2020 Duration: 23.25 minutes Patient history: 25 year old male admitted for seizures.  EEG telemetry for seizures. Level of alertness: Awake AEDs during EEG study: Depakote, carbamazepine, lamotrigine Technical aspects: This EEG study was done with scalp electrodes positioned according to the 10-20 International system of electrode placement. Electrical activity was acquired at a sampling rate of 500Hz  and reviewed with a high frequency filter of 70Hz  and a low frequency filter of 1Hz . EEG data were recorded continuously and digitally stored. Description: No clear posterior dominant rhythm was seen.  EEG showed 15 to 18 Hz beta activity distributed symmetrically and diffusely. Hyperventilation did not show any EEG change.  Physiologic photic driving was seen during photic stimulation.    ABNORMALITY -Excessive  beta, generalized IMPRESSION: This study is within normal limits. The excessive beta activity seen in the background is most likely due to the effect of benzodiazepine and is a benign EEG pattern. No seizures or epileptiform discharges were seen throughout the recording. Charlsie QuestPriyanka O Yadav   CT HEAD WO CONTRAST  Result Date:  11/06/2020 CLINICAL DATA:  Seizure EXAM: CT HEAD WITHOUT CONTRAST TECHNIQUE: Contiguous axial images were obtained from the base of the skull through the vertex without intravenous contrast. COMPARISON:  08/09/2019 FINDINGS: Brain: No evidence of acute infarction, hemorrhage, hydrocephalus, extra-axial collection or mass lesion/mass effect. Vascular: No hyperdense vessel or unexpected calcification. Skull: Normal. Negative for fracture or focal lesion. Sinuses/Orbits: Partial opacification of the ethmoid air cells, unchanged compared to prior. Other: None. IMPRESSION: 1. No acute intracranial pathology. 2. Partial opacification of the ethmoid air cells, unchanged compared to prior. Correlate for sinusitis. Electronically Signed   By: Lauralyn PrimesAlex  Bibbey M.D.   On: 11/06/2020 11:45       Subjective:  No new complaints Discharge Exam: Vitals:   11/07/20 2048 11/08/20 0547  BP: 125/60 119/70  Pulse: 69 63  Resp: 20 16  Temp: 98.2 F (36.8 C) 98.1 F (36.7 C)  SpO2: 98% 99%   Vitals:   11/07/20 0420 11/07/20 1540 11/07/20 2048 11/08/20 0547  BP: 104/64 132/68 125/60 119/70  Pulse: (!) 59 60 69 63  Resp: 16 14 20 16   Temp: 98.4 F (36.9 C) 98.2 F (36.8 C) 98.2 F (36.8 C) 98.1 F (36.7 C)  TempSrc: Oral Oral Oral Oral  SpO2: 98% 98% 98% 99%  Weight:      Height:        General: Pt is alert, awake, not in acute distress Cardiovascular: RRR, S1/S2 +, no rubs, no gallops Respiratory: CTA bilaterally, no wheezing, no rhonchi Abdominal: Soft, NT, ND, bowel sounds + Extremities: no edema, no cyanosis    The results of significant diagnostics from this hospitalization (including imaging, microbiology, ancillary and laboratory) are listed below for reference.     Microbiology: Recent Results (from the past 240 hour(s))  Resp Panel by RT-PCR (Flu A&B, Covid) Nasopharyngeal Swab     Status: None   Collection Time: 11/06/20 12:24 AM   Specimen: Nasopharyngeal Swab; Nasopharyngeal(NP)  swabs in vial transport medium  Result Value Ref Range Status   SARS Coronavirus 2 by RT PCR NEGATIVE NEGATIVE Final    Comment: (NOTE) SARS-CoV-2 target nucleic acids are NOT DETECTED.  The SARS-CoV-2 RNA is generally detectable in upper respiratory specimens during the acute phase of infection. The lowest concentration of SARS-CoV-2 viral copies this assay can detect is 138 copies/mL. A negative result does not preclude SARS-Cov-2 infection and should not be used as the sole basis for treatment or other patient management decisions. A negative result may occur with  improper specimen collection/handling, submission of specimen other than nasopharyngeal swab, presence of viral mutation(s) within the areas targeted by this assay, and inadequate number of viral copies(<138 copies/mL). A negative result must be combined with clinical observations, patient history, and epidemiological information. The expected result is Negative.  Fact Sheet for Patients:  BloggerCourse.comhttps://www.fda.gov/media/152166/download  Fact Sheet for Healthcare Providers:  SeriousBroker.ithttps://www.fda.gov/media/152162/download  This test is no t yet approved or cleared by the [place] FDA and  has been authorized for detection and/or diagnosis of SARS-CoV-2 by FDA under an Emergency Use Authorization (EUA). This EUA will remain  in effect (meaning this test can be used) for the duration of the COVID-19 declaration  under Section 564(b)(1) of the Act, 21 U.S.C.section 360bbb-3(b)(1), unless the authorization is terminated  or revoked sooner.       Influenza A by PCR NEGATIVE NEGATIVE Final   Influenza B by PCR NEGATIVE NEGATIVE Final    Comment: (NOTE) The Xpert Xpress SARS-CoV-2/FLU/RSV plus assay is intended as an aid in the diagnosis of influenza from Nasopharyngeal swab specimens and should not be used as a sole basis for treatment. Nasal washings and aspirates are unacceptable for Xpert Xpress  SARS-CoV-2/FLU/RSV testing.  Fact Sheet for Patients: BloggerCourse.com  Fact Sheet for Healthcare Providers: SeriousBroker.it  This test is not yet approved or cleared by the [place] FDA and has been authorized for detection and/or diagnosis of SARS-CoV-2 by FDA under an Emergency Use Authorization (EUA). This EUA will remain in effect (meaning this test can be used) for the duration of the COVID-19 declaration under Section 564(b)(1) of the Act, 21 U.S.C. section 360bbb-3(b)(1), unless the authorization is terminated or revoked.  Performed at [facility], [address], [place], [place] [address]      Labs: BNP (last 3 results) No results for input(s): BNP in the last 8760 hours. Basic Metabolic Panel: Recent Labs  Lab 11/05/20 2304  NA 139  K 4.0  CL 104  CO2 28  GLUCOSE 99  BUN 13  CREATININE 0.95  CALCIUM 9.0   Liver Function Tests: Recent Labs  Lab 11/05/20 2304  AST 27  ALT 37  ALKPHOS 55  BILITOT 0.4  PROT 7.4  ALBUMIN 4.0   No results for input(s): LIPASE, AMYLASE in the last 168 hours. No results for input(s): AMMONIA in the last 168 hours. CBC: Recent Labs  Lab 11/05/20 2304  WBC 7.7  NEUTROABS 4.2  HGB 14.6  HCT 45.6  MCV 83.7  PLT 237   Cardiac Enzymes: No results for input(s): CKTOTAL, CKMB, CKMBINDEX, TROPONINI in the last 168 hours. BNP: Invalid input(s): POCBNP CBG: Recent Labs  Lab 11/05/20 2142  GLUCAP 99   D-Dimer No results for input(s): DDIMER in the last 72 hours. Hgb A1c No results for input(s): HGBA1C in the last 72 hours. Lipid Profile No results for input(s): CHOL, HDL, LDLCALC, TRIG, CHOLHDL, LDLDIRECT in the last 72 hours. Thyroid function studies No results for input(s): TSH, T4TOTAL, T3FREE, THYROIDAB in the last 72 hours.  Invalid input(s): FREET3 Anemia work up No results for input(s): VITAMINB12, FOLATE, FERRITIN, TIBC, IRON,  RETICCTPCT in the last 72 hours. Urinalysis    Component Value Date/Time   COLORURINE YELLOW 11/05/2020 2253   APPEARANCEUR CLEAR 11/05/2020 2253   LABSPEC 1.023 11/05/2020 2253   PHURINE 8.0 11/05/2020 2253   GLUCOSEU NEGATIVE 11/05/2020 2253   HGBUR NEGATIVE 11/05/2020 2253   BILIRUBINUR NEGATIVE 11/05/2020 2253   KETONESUR NEGATIVE 11/05/2020 2253   PROTEINUR NEGATIVE 11/05/2020 2253   NITRITE NEGATIVE 11/05/2020 2253   LEUKOCYTESUR NEGATIVE 11/05/2020 2253   Sepsis Labs Invalid input(s): PROCALCITONIN,  WBC,  LACTICIDVEN Microbiology Recent Results (from the past 240 hour(s))  Resp Panel by RT-PCR (Flu A&B, Covid) Nasopharyngeal Swab     Status: None   Collection Time: 11/06/20 12:24 AM   Specimen: Nasopharyngeal Swab; Nasopharyngeal(NP) swabs in vial transport medium  Result Value Ref Range Status   SARS Coronavirus 2 by RT PCR NEGATIVE NEGATIVE Final    Comment: (NOTE) SARS-CoV-2 target nucleic acids are NOT DETECTED.  The SARS-CoV-2 RNA is generally detectable in upper respiratory specimens during the acute phase of infection. The lowest concentration  of SARS-CoV-2 viral copies this assay can detect is 138 copies/mL. A negative result does not preclude SARS-Cov-2 infection and should not be used as the sole basis for treatment or other patient management decisions. A negative result may occur with  improper specimen collection/handling, submission of specimen other than nasopharyngeal swab, presence of viral mutation(s) within the areas targeted by this assay, and inadequate number of viral copies(<138 copies/mL). A negative result must be combined with clinical observations, patient history, and epidemiological information. The expected result is Negative.  Fact Sheet for Patients:  BloggerCourse.com  Fact Sheet for Healthcare Providers:  SeriousBroker.it  This test is no t yet approved or cleared by the [place] FDA and  has been authorized for detection and/or diagnosis of SARS-CoV-2 by FDA under an Emergency Use Authorization (EUA). This EUA will remain  in effect (meaning this test can be used) for the duration of the COVID-19 declaration under Section 564(b)(1) of the Act, 21 U.S.C.section 360bbb-3(b)(1), unless the authorization is terminated  or revoked sooner.       Influenza A by PCR NEGATIVE NEGATIVE Final   Influenza B by PCR NEGATIVE NEGATIVE Final    Comment: (NOTE) The Xpert Xpress SARS-CoV-2/FLU/RSV plus assay is intended as an aid in the diagnosis of influenza from Nasopharyngeal swab specimens and should not be used as a sole basis for treatment. Nasal washings and aspirates are unacceptable for Xpert Xpress SARS-CoV-2/FLU/RSV testing.  Fact Sheet for Patients: BloggerCourse.com  Fact Sheet for Healthcare Providers: SeriousBroker.it  This test is not yet approved or cleared by the [place] FDA and has been authorized for detection and/or diagnosis of SARS-CoV-2 by FDA under an Emergency Use Authorization (EUA). This EUA will remain in effect (meaning this test can be used) for the duration of the COVID-19 declaration under Section 564(b)(1) of the Act, 21 U.S.C. section 360bbb-3(b)(1), unless the authorization is terminated or revoked.  Performed at [facility], [address], [place], [place] [address]      Time coordinating discharge: 51 MINUTES  SIGNED:   Kathlen Mody, MD  [facility] 11/08/2020, 10:26 AM

## 2020-12-24 ENCOUNTER — Encounter (HOSPITAL_COMMUNITY): Payer: Self-pay

## 2020-12-24 ENCOUNTER — Emergency Department (HOSPITAL_COMMUNITY)
Admission: EM | Admit: 2020-12-24 | Discharge: 2020-12-25 | Disposition: A | Discharge status: Home / Self Care | Payer: Medicaid Other | Attending: Emergency Medicine | Admitting: Emergency Medicine

## 2020-12-24 DIAGNOSIS — R569 Unspecified convulsions: Secondary | ICD-10-CM

## 2020-12-24 DIAGNOSIS — G40911 Epilepsy, unspecified, intractable, with status epilepticus: Secondary | ICD-10-CM | POA: Diagnosis not present

## 2020-12-24 HISTORY — DX: Unspecified convulsions: R56.9

## 2020-12-24 LAB — CBC WITH DIFFERENTIAL/PLATELET
Abs Immature Granulocytes: 0.01 10*3/uL (ref 0.00–0.07)
Basophils Absolute: 0 10*3/uL (ref 0.0–0.1)
Basophils Relative: 1 %
Eosinophils Absolute: 0.1 10*3/uL (ref 0.0–0.5)
Eosinophils Relative: 2 %
HCT: 42.4 % (ref 39.0–52.0)
Hemoglobin: 14.3 g/dL (ref 13.0–17.0)
Immature Granulocytes: 0 %
Lymphocytes Relative: 29 %
Lymphs Abs: 1.5 10*3/uL (ref 0.7–4.0)
MCH: 27.6 pg (ref 26.0–34.0)
MCHC: 33.7 g/dL (ref 30.0–36.0)
MCV: 81.9 fL (ref 80.0–100.0)
Monocytes Absolute: 0.4 10*3/uL (ref 0.1–1.0)
Monocytes Relative: 9 %
Neutro Abs: 3 10*3/uL (ref 1.7–7.7)
Neutrophils Relative %: 59 %
Platelets: 211 10*3/uL (ref 150–400)
RBC: 5.18 MIL/uL (ref 4.22–5.81)
RDW: 12.5 % (ref 11.5–15.5)
WBC: 5.1 10*3/uL (ref 4.0–10.5)
nRBC: 0 % (ref 0.0–0.2)

## 2020-12-24 LAB — COMPREHENSIVE METABOLIC PANEL
ALT: 33 U/L (ref 0–44)
AST: 23 U/L (ref 15–41)
Albumin: 4 g/dL (ref 3.5–5.0)
Alkaline Phosphatase: 58 U/L (ref 38–126)
Anion gap: 8 (ref 5–15)
BUN: 8 mg/dL (ref 6–20)
CO2: 27 mmol/L (ref 22–32)
Calcium: 8.9 mg/dL (ref 8.9–10.3)
Chloride: 104 mmol/L (ref 98–111)
Creatinine, Ser: 0.97 mg/dL (ref 0.61–1.24)
GFR, Estimated: >60 mL/min (ref >60)
Glucose, Bld: 94 mg/dL (ref 70–99)
Potassium: 3.4 mmol/L — ABNORMAL LOW (ref 3.5–5.1)
Sodium: 139 mmol/L (ref 135–145)
Total Bilirubin: 0.3 mg/dL (ref 0.3–1.2)
Total Protein: 6.4 g/dL — ABNORMAL LOW (ref 6.5–8.1)

## 2020-12-24 LAB — CARBAMAZEPINE LEVEL, TOTAL: Carbamazepine Lvl: 6.8 ug/mL (ref 4.0–12.0)

## 2020-12-24 LAB — ETHANOL: Alcohol, Ethyl (B): <10 mg/dL (ref <10)

## 2020-12-24 NOTE — ED Provider Notes (Signed)
[facility] DEPT [facility] Emergency Department Provider Note MRN:  [id number]  Arrival date & time: 12/25/20     Chief Complaint   Seizures   History of Present Illness   Ronald Jimenez is a 25 y.o. year-old male with a history of seizure disorder presenting to the ED with chief complaint of seizure.  Witnessed seizure and jail at about 7 PM this evening.  Sudden onset, no warning, fell backwards and hit his head.  Seizure activity noted by staff.  Tonic-clonic, fairly quick return to baseline.  Has been feeling well since that time, some mild soreness to the back of the head but no nausea or vomiting since the fall, no numbness or weakness to the arms or legs.  No neck pain, no chest pain or shortness of breath, no abdominal pain, no other complaints.  Taking his medicines like normal, denies drugs or alcohol.  Review of Systems  A complete 10 system review of systems was obtained and all systems are negative except as noted in the HPI and PMH.   Patient's Health History    Past Medical History:  Diagnosis Date  . Seizures (HCC)     History reviewed. No pertinent surgical history.  No family history on file.  Social History   Socioeconomic History  . Marital status: Single    Spouse name: Not on file  . Number of children: Not on file  . Years of education: Not on file  . Highest education level: Not on file  Occupational History  . Not on file  Tobacco Use  . Smoking status: Never Smoker  . Smokeless tobacco: Never Used  Vaping Use  . Vaping Use: Unknown  Substance and Sexual Activity  . Alcohol use: Not Currently  . Drug use: Not Currently  . Sexual activity: Not Currently  Other Topics Concern  . Not on file  Social History Narrative  . Not on file   Social Determinants of Health   Financial Resource Strain: Not on file  Food Insecurity: Not on file  Transportation Needs: Not on file  Physical Activity: Not on file  Stress: Not on file  Social  Connections: Not on file  Intimate Partner Violence: Not on file     Physical Exam   Vitals:   12/24/20 2044 12/25/20 0036  BP: 136/85 125/64  Pulse: 68 65  Resp: 17 16  Temp: 99 F (37.2 C) 99.4 F (37.4 C)  SpO2: 99% 100%    CONSTITUTIONAL: Well-appearing, NAD NEURO:  Alert and oriented x 3, no focal deficits EYES:  eyes equal and reactive ENT/NECK:  no LAD, no JVD CARDIO: Regular rate, well-perfused, normal S1 and S2 PULM:  CTAB no wheezing or rhonchi GI/GU:  normal bowel sounds, non-distended, non-tender MSK/SPINE:  No gross deformities, no edema, normal range of motion of the neck with no cervical tenderness SKIN:  no rash, atraumatic PSYCH:  Appropriate speech and behavior  *Additional and/or pertinent findings included in MDM below  Diagnostic and Interventional Summary    EKG Interpretation  Date/Time:  Sunday December 25 2020 00:19:46 EST Ventricular Rate:  68 PR Interval:    QRS Duration: 85 QT Interval:  379 QTC Calculation: 403 R Axis:   71 Text Interpretation: Sinus rhythm Probable left atrial enlargement ST elev, probable normal early repol pattern Confirmed by Kennis Carina [id number]) on 12/25/2020 2:53:11 AM      Labs Reviewed  COMPREHENSIVE METABOLIC PANEL - Abnormal; Notable for the following components:  Result Value   Potassium 3.4 (*)    Total Protein 6.4 (*)    All other components within normal limits  VALPROIC ACID LEVEL - Abnormal; Notable for the following components:   Valproic Acid Lvl <10 (*)    All other components within normal limits  CARBAMAZEPINE LEVEL, TOTAL  CBC WITH DIFFERENTIAL/PLATELET  ETHANOL  CBG MONITORING, ED    No orders to display    Medications  valproate (DEPACON) 1,250 mg in dextrose 5 % 50 mL IVPB (1,250 mg Intravenous New Bag/Given 12/25/20 0201)     Procedures  /  Critical Care Procedures  ED Course and Medical Decision Making  I have reviewed the triage vital signs, the nursing notes, and pertinent  available records from the EMR.  Listed above are laboratory and imaging tests that I personally ordered, reviewed, and interpreted and then considered in my medical decision making (see below for details).  Seizure, known seizure disorder, endorses compliance with medications.  Has not seen a neurologist since his admission back in December.  Suspect he simply needs a medication adjustment.  He is back to baseline, well-appearing, normal vital signs.  Discussed case with Dr. Wilford Corner of neurology.  Plan is to add on Depakote level and increase dose of either Depakote or Tegretol based on this.     Patient's Depakote level is low, suspect he is not getting this medication. Represcribed all antiepileptics, will try to make clear in the discharge instructions so that the jail gives his what he needs.  Loaded with depakote here per neuro recs, appropriate for dc.  Elmer Sow. Pilar Plate, MD [facility] Emergency Medicine [facility] [email or web address]  Final Clinical Impressions(s) / ED Diagnoses     ICD-10-CM   1. Seizure (HCC)  R56.9     ED Discharge Orders         Ordered    carbamazepine (TEGRETOL) 200 MG tablet  2 times daily        12/25/20 0250    divalproex (DEPAKOTE) 500 MG DR tablet  2 times daily        12/25/20 0250    lamoTRIgine (LAMICTAL) 100 MG tablet  Daily        12/25/20 0250    lamoTRIgine (LAMICTAL) 25 MG tablet  Every evening        12/25/20 0250           Discharge Instructions Discussed with and Provided to Patient:     Discharge Instructions     You were evaluated in the Emergency Department and after careful evaluation, we did not find any emergent condition requiring admission or further testing in the hospital.  Your exam/testing today is overall reassuring. Symptoms seem to be due to a seizure. It is very important that you take the medications as prescribed. Tegretol 200 mg twice daily. Depakote 500 mg twice daily. Lamictal 100 mg in the  morning, 50 mg in the evening.  Please return to the Emergency Department if you experience any worsening of your condition.   Thank you for allowing [place] to be a part of your care.       Sabas Sous, MD 12/25/20 [phone]

## 2020-12-24 NOTE — ED Triage Notes (Signed)
Pt comes [facility] from jail, witnessed seizure by staff lasted about 4 minutes, hit head, no post ictal state, AxO x 4. No loss or bowel and bladder

## 2020-12-25 LAB — VALPROIC ACID LEVEL: Valproic Acid Lvl: <10 ug/mL — ABNORMAL LOW (ref 50.0–100.0)

## 2020-12-25 MED ORDER — LAMOTRIGINE 25 MG PO TABS: 50.0000 mg | ORAL_TABLET | Freq: Every evening | ORAL | 1 refills | Status: AC

## 2020-12-25 MED ORDER — LAMOTRIGINE 100 MG PO TABS: 100.0000 mg | ORAL_TABLET | Freq: Every day | ORAL | 1 refills | Status: AC

## 2020-12-25 MED ORDER — VALPROATE SODIUM 100 MG/ML IV SOLN
1250.0000 mg | Freq: Once | INTRAVENOUS | Status: AC
  Administered 2020-12-25: 1250 mg via INTRAVENOUS
  Filled 2020-12-25: qty 12.5

## 2020-12-25 MED ORDER — CARBAMAZEPINE 200 MG PO TABS: 200.0000 mg | ORAL_TABLET | Freq: Two times a day (BID) | ORAL | 1 refills | Status: AC

## 2020-12-25 MED ORDER — DIVALPROEX SODIUM 500 MG PO DR TAB
500.0000 mg | DELAYED_RELEASE_TABLET | Freq: Two times a day (BID) | ORAL | 1 refills | Status: AC
Start: 2020-12-25 — End: ?

## 2020-12-25 NOTE — ED Notes (Signed)
Discharge instructions discussed with pt. Pt verbalized understanding. Pt stable and ambulatory. No signature pad available. 

## 2020-12-25 NOTE — Discharge Instructions (Addendum)
You were evaluated in the Emergency Department and after careful evaluation, we did not find any emergent condition requiring admission or further testing in the hospital.  Your exam/testing today is overall reassuring. Symptoms seem to be due to a seizure. It is very important that you take the medications as prescribed. Tegretol 200 mg twice daily. Depakote 500 mg twice daily. Lamictal 100 mg in the morning, 50 mg in the evening.  Please return to the Emergency Department if you experience any worsening of your condition.   Thank you for allowing [place] to be a part of your care.

## 2022-07-07 IMAGING — CT CT HEAD W/O CM
2 of 3 series · 14 of 47 positions shown, 17 images · non-contrast
Comparison: 08/09/2019

CLINICAL DATA: Seizure

EXAM:
CT HEAD WITHOUT CONTRAST
TECHNIQUE: Contiguous axial images were obtained from the base of the skull
through the vertex without intravenous contrast.

[Series 2: head wo · axial · 0.47mm/px · z∈[+1514,+1644]mm · 11 of 32 slices shown, 14 images]
[im 3/32  brain]
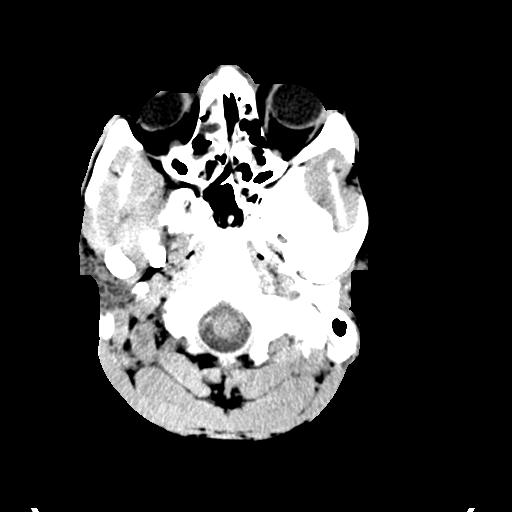
[im 3/32  bone]
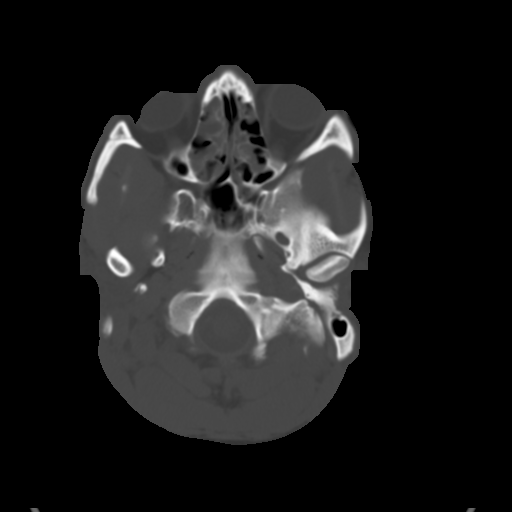
[im 5/32  brain]
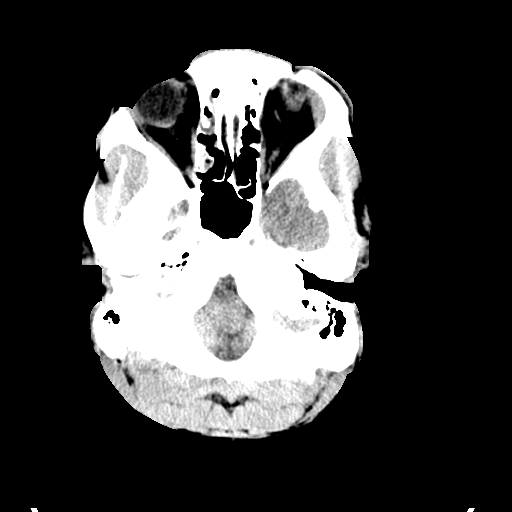
[im 8/32  brain]
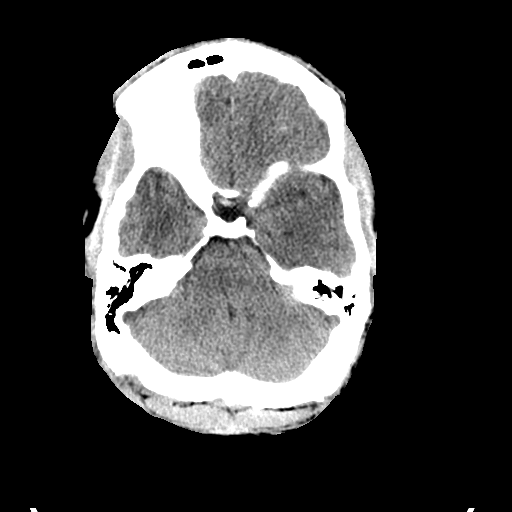
[im 10/32  brain]
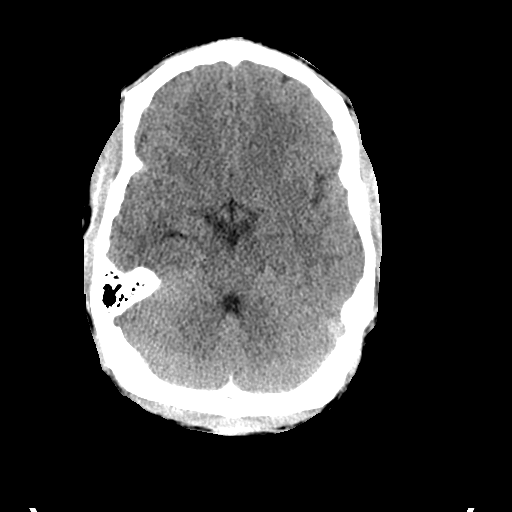
[im 13/32  brain]
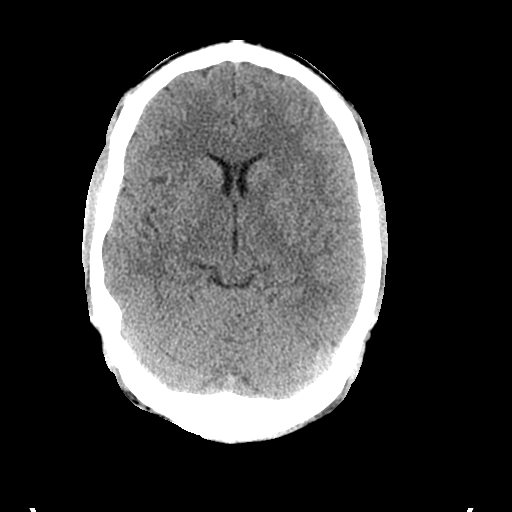
[im 13/32  bone]
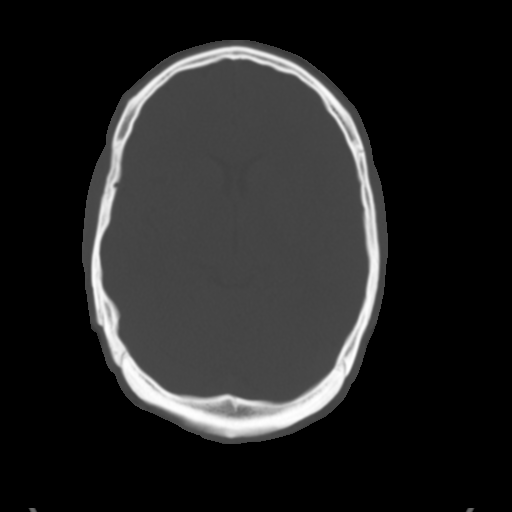
[im 17/32  brain]
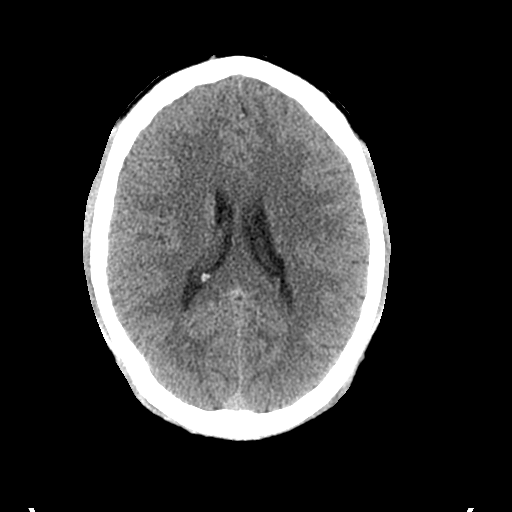
[im 19/32  brain]
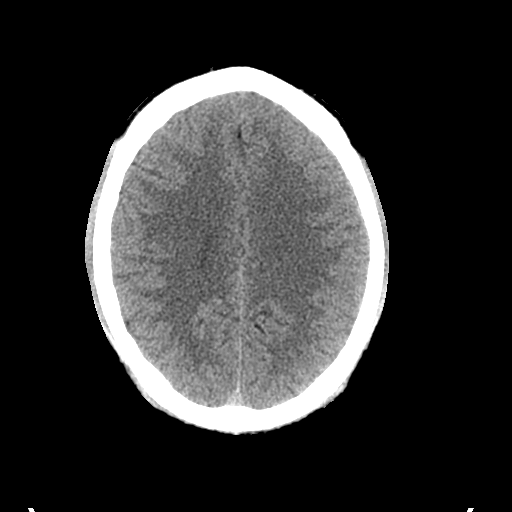
[im 22/32  brain]
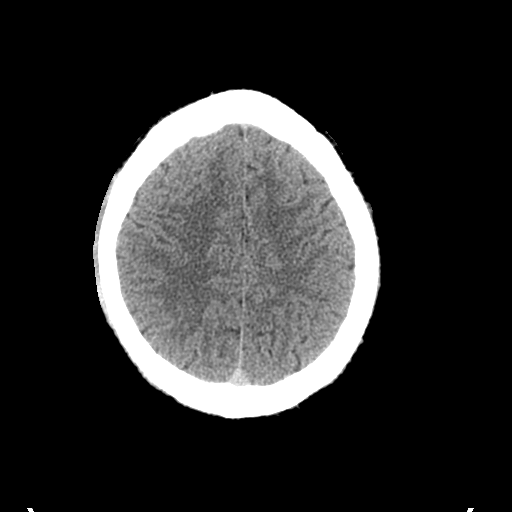
[im 24/32  brain]
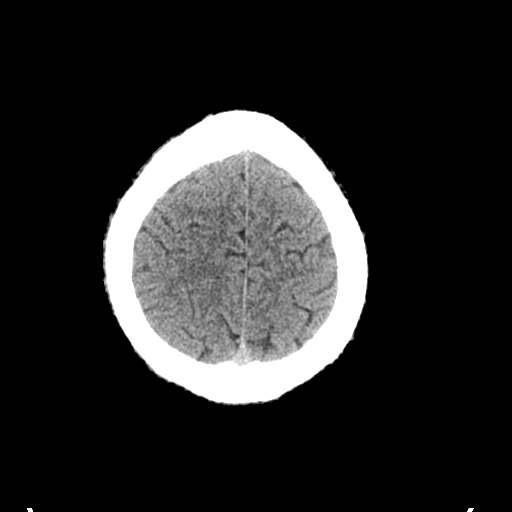
[im 24/32  bone]
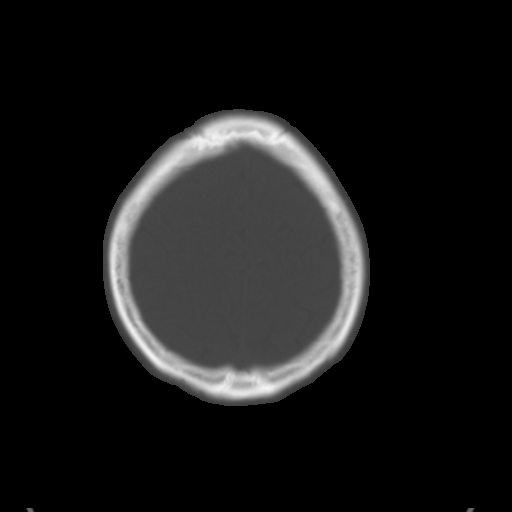
[im 27/32  brain]
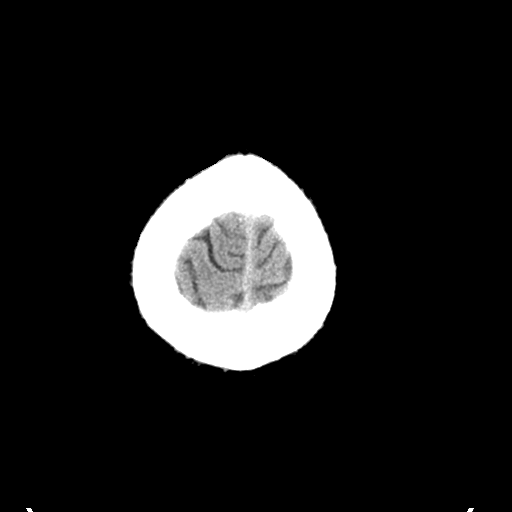
[im 29/32  brain]
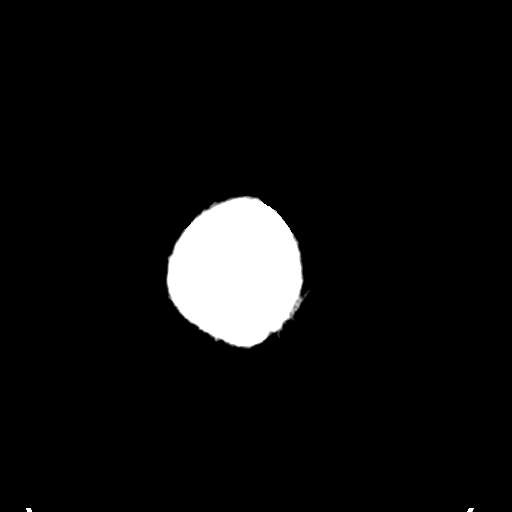

[Series 5: coronal soft tissue · coronal · 0.31mm/px · 3 of 73 slices shown]
[im 25/73  brain]
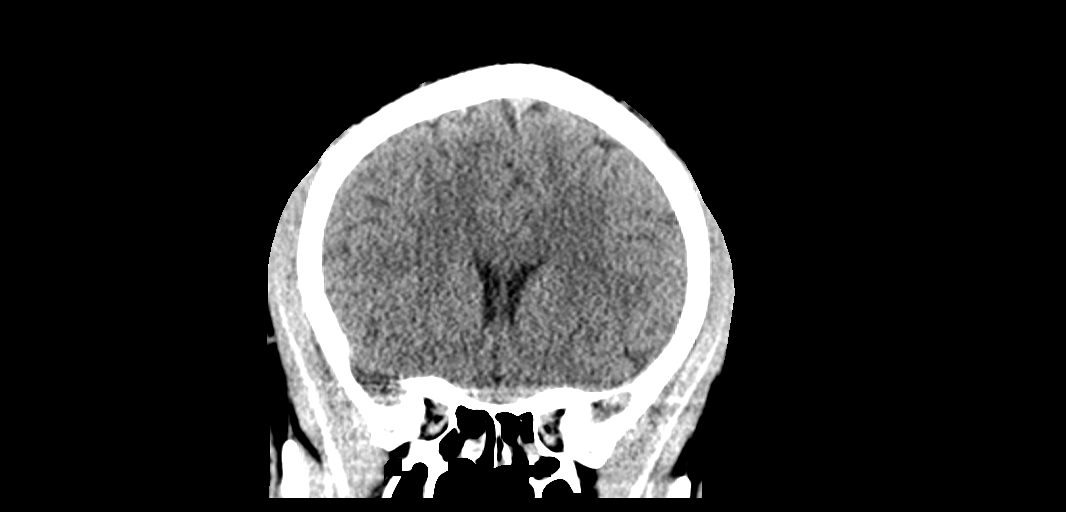
[im 33/73  brain]
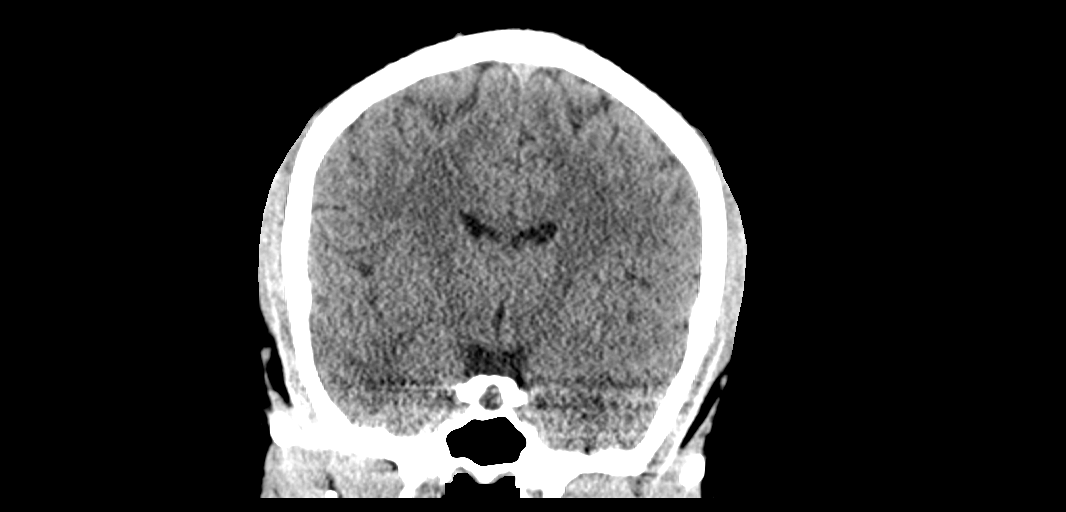
[im 41/73  brain]
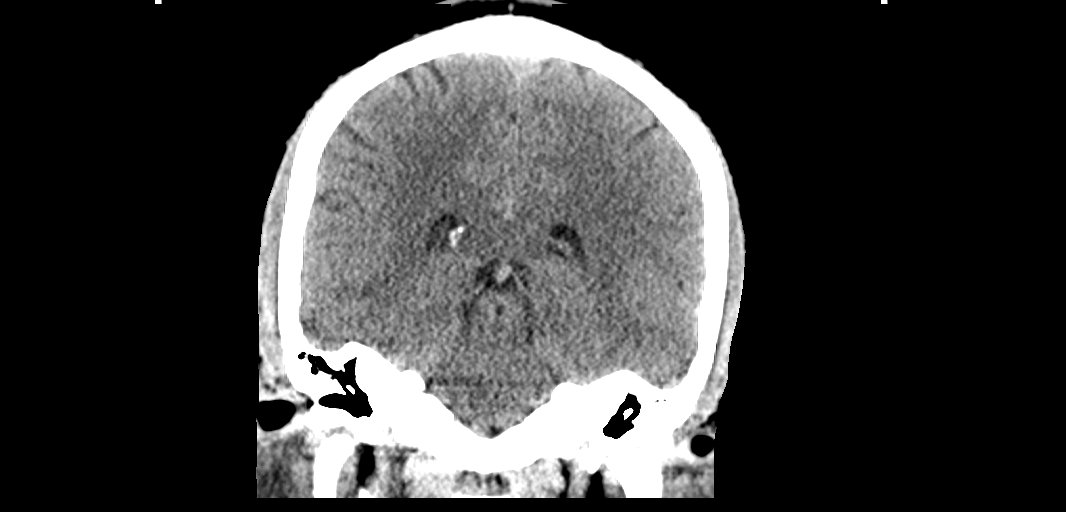

[14 of 47 positions shown; findings below may reference images not displayed]

FINDINGS: Brain: No evidence of acute infarction, hemorrhage, hydrocephalus,
extra-axial collection or mass lesion/mass effect.

Vascular: No hyperdense vessel or unexpected calcification.

Skull: Normal. Negative for fracture or focal lesion.

Sinuses/Orbits: Partial opacification of the ethmoid air cells,
unchanged compared to prior.

Other: None.
IMPRESSION: 1. No acute intracranial pathology.
2. Partial opacification of the ethmoid air cells, unchanged
compared to prior. Correlate for sinusitis.
